# Patient Record
Sex: Female | Born: 1965 | Hispanic: No | Marital: Married | Smoking: Never smoker
Health system: Southern US, Community
[De-identification: ages and names within clinical notes are randomized; demographics above are authoritative.]

## PROBLEM LIST (undated history)

## (undated) DIAGNOSIS — E119 Type 2 diabetes mellitus without complications: Secondary | ICD-10-CM

## (undated) DIAGNOSIS — I10 Essential (primary) hypertension: Secondary | ICD-10-CM

---

## 2018-11-20 ENCOUNTER — Other Ambulatory Visit: Payer: Self-pay

## 2018-11-20 ENCOUNTER — Encounter: Payer: Self-pay | Admitting: *Deleted

## 2018-11-20 ENCOUNTER — Emergency Department
Admission: EM | Admit: 2018-11-20 | Discharge: 2018-11-20 | Disposition: A | Discharge status: Home / Self Care | Payer: No Typology Code available for payment source | Attending: Emergency Medicine | Admitting: Emergency Medicine

## 2018-11-20 ENCOUNTER — Emergency Department: Payer: No Typology Code available for payment source

## 2018-11-20 DIAGNOSIS — E119 Type 2 diabetes mellitus without complications: Secondary | ICD-10-CM | POA: Diagnosis not present

## 2018-11-20 DIAGNOSIS — I10 Essential (primary) hypertension: Secondary | ICD-10-CM | POA: Insufficient documentation

## 2018-11-20 DIAGNOSIS — R0781 Pleurodynia: Secondary | ICD-10-CM | POA: Insufficient documentation

## 2018-11-20 HISTORY — DX: Essential (primary) hypertension: I10

## 2018-11-20 HISTORY — DX: Type 2 diabetes mellitus without complications: E11.9

## 2018-11-20 MED ORDER — IBUPROFEN 600 MG PO TABS
600.0000 mg | ORAL_TABLET | Freq: Once | ORAL | Status: AC
  Administered 2018-11-20: 600 mg via ORAL
  Filled 2018-11-20: qty 1

## 2018-11-20 MED ORDER — CYCLOBENZAPRINE HCL 10 MG PO TABS
10.0000 mg | ORAL_TABLET | Freq: Once | ORAL | Status: AC
  Administered 2018-11-20: 10 mg via ORAL
  Filled 2018-11-20: qty 1

## 2018-11-20 MED ORDER — IBUPROFEN 600 MG PO TABS: 600.0000 mg | ORAL_TABLET | Freq: Three times a day (TID) | ORAL | 0 refills | Status: AC | PRN

## 2018-11-20 MED ORDER — CYCLOBENZAPRINE HCL 5 MG PO TABS: 5.0000 mg | ORAL_TABLET | Freq: Three times a day (TID) | ORAL | 0 refills | Status: AC | PRN
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
## 2018-12-01 ENCOUNTER — Other Ambulatory Visit: Payer: Self-pay | Admitting: Neurology

## 2018-12-01 ENCOUNTER — Other Ambulatory Visit (HOSPITAL_COMMUNITY): Payer: Self-pay | Admitting: Neurology

## 2018-12-01 DIAGNOSIS — R109 Unspecified abdominal pain: Secondary | ICD-10-CM

## 2018-12-12 ENCOUNTER — Ambulatory Visit: Admission: RE | Admit: 2018-12-12 | Payer: Self-pay | Source: Ambulatory Visit
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
